# Patient Record
Sex: Male | Born: 1990 | Race: White | Hispanic: No | Marital: Single | Smoking: Never smoker
Health system: Southern US, Community
[De-identification: ages and names within clinical notes are randomized; demographics above are authoritative.]

## PROBLEM LIST (undated history)

## (undated) DIAGNOSIS — Q676 Pectus excavatum: Secondary | ICD-10-CM

## (undated) DIAGNOSIS — J45909 Unspecified asthma, uncomplicated: Secondary | ICD-10-CM

## (undated) DIAGNOSIS — T782XXA Anaphylactic shock, unspecified, initial encounter: Secondary | ICD-10-CM

## (undated) DIAGNOSIS — T7840XA Allergy, unspecified, initial encounter: Secondary | ICD-10-CM

## (undated) DIAGNOSIS — L709 Acne, unspecified: Secondary | ICD-10-CM

## (undated) DIAGNOSIS — N200 Calculus of kidney: Secondary | ICD-10-CM

## (undated) DIAGNOSIS — F909 Attention-deficit hyperactivity disorder, unspecified type: Secondary | ICD-10-CM

## (undated) HISTORY — DX: Pectus excavatum: Q67.6

## (undated) HISTORY — DX: Allergy, unspecified, initial encounter: T78.40XA

## (undated) HISTORY — DX: Calculus of kidney: N20.0

## (undated) HISTORY — DX: Attention-deficit hyperactivity disorder, unspecified type: F90.9

## (undated) HISTORY — DX: Anaphylactic shock, unspecified, initial encounter: T78.2XXA

## (undated) HISTORY — PX: PERCUTANEOUS NEPHROSTOLITHOTOMY: SHX2207

## (undated) HISTORY — DX: Acne, unspecified: L70.9

## (undated) HISTORY — DX: Unspecified asthma, uncomplicated: J45.909

---
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
## 2013-09-06 ENCOUNTER — Encounter: Payer: Self-pay | Admitting: Family Medicine

## 2013-09-06 ENCOUNTER — Ambulatory Visit (HOSPITAL_BASED_OUTPATIENT_CLINIC_OR_DEPARTMENT_OTHER)
Admission: RE | Admit: 2013-09-06 | Discharge: 2013-09-06 | Disposition: A | Discharge status: Home / Self Care | Payer: BC Managed Care – PPO | Source: Ambulatory Visit | Attending: Family Medicine | Admitting: Family Medicine

## 2013-09-06 ENCOUNTER — Ambulatory Visit (INDEPENDENT_AMBULATORY_CARE_PROVIDER_SITE_OTHER): Payer: BC Managed Care – PPO | Admitting: Family Medicine

## 2013-09-06 VITALS — BP 117/72 | HR 78 | Resp 16 | Ht 64.0 in | Wt 113.0 lb

## 2013-09-06 DIAGNOSIS — S8990XA Unspecified injury of unspecified lower leg, initial encounter: Secondary | ICD-10-CM | POA: Insufficient documentation

## 2013-09-06 DIAGNOSIS — X58XXXA Exposure to other specified factors, initial encounter: Secondary | ICD-10-CM | POA: Insufficient documentation

## 2013-09-06 DIAGNOSIS — T782XXA Anaphylactic shock, unspecified, initial encounter: Secondary | ICD-10-CM

## 2013-09-06 DIAGNOSIS — M79609 Pain in unspecified limb: Secondary | ICD-10-CM

## 2013-09-06 DIAGNOSIS — M79675 Pain in left toe(s): Secondary | ICD-10-CM

## 2013-09-06 DIAGNOSIS — S9030XA Contusion of unspecified foot, initial encounter: Secondary | ICD-10-CM | POA: Insufficient documentation

## 2013-09-06 MED ORDER — EPINEPHRINE 0.3 MG/0.3ML IJ SOAJ: 0.3000 mg | Freq: Once | INTRAMUSCULAR | Status: AC

## 2013-09-06 NOTE — Progress Notes (Signed)
  Subjective:    Patient ID: Cory Ochoa, male    DOB: 1991-08-27, 22 y.o.   MRN: [id number]  Cory Ochoa is here today with his mother complaining of pain in his left great toe.  He was playing in his room and stubbed his toe.    Injury The incident occurred 12 to 24 hours ago. The incident occurred at home. Injury mechanism: fall. There is an injury to the left great toe. The pain is mild. (Bruise)    Review of Systems  Constitutional: Negative.   HENT: Negative.   Eyes: Negative.   Respiratory: Negative.   Endocrine: Negative.   Genitourinary: Negative.   Musculoskeletal: Negative.   Skin: Negative.   Allergic/Immunologic: Negative.   Neurological: Negative.   Hematological: Bruises/bleeds easily.       Left great toe.  Psychiatric/Behavioral: Negative.      Past Medical History  Diagnosis Date  . ADHD (attention deficit hyperactivity disorder)   . Asthma   . Allergy   . Acne   . Pectus excavatum   . Anaphylaxis     Bees, Eggs, Ranch, [place], Pork,Roses     History reviewed. No pertinent past surgical history.   History   Social History Narrative   Marital Status:  Married Research officer, political party)   Children:  Sons Cory Ochoa, Cory Ochoa) Daughter Cory Ochoa)   Pets:  Dogs (2)    Living Situation: Lives with spouse and children.   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 Obesity Mother   . Hypertension Mother   . Kidney Stones Mother   . Obesity Father   . Hyperlipidemia Father   . Hypertension Father   . Obesity Sister   . Diabetes Paternal Grandmother      Allergies  Allergen Reactions  . Augmentin [Amoxicillin-Pot Clavulanate] Hives     Immunization History  Administered Date(s) Administered  . Hepatitis  A 07/01/2008  . Meningococcal Conjugate 10/09/2006  . Pneumococcal Conjugate-13 08/28/2009  . Tdap 06/16/2006       Objective:   Physical Exam  Vitals reviewed. Constitutional: He is oriented to person, place, and time. He appears well-developed and well-nourished.  Cardiovascular: Normal rate and regular rhythm.   Pulmonary/Chest: Effort normal and breath sounds normal.  Musculoskeletal: He exhibits edema and tenderness.  Left great toe is bruised.    Neurological: He is alert and oriented to person, place, and time.  Psychiatric: He has a normal mood and affect.      Assessment & Plan:    Thi was seen today for toe injury.  Diagnoses and associated orders for this visit:  Pain in joint, ankle and foot, left - DG Foot Complete Left - Normal   Anaphylactic reaction, subsequent encounter - EPINEPHrine (EPI-PEN) 0.3 mg/0.3 mL SOAJ injection; Inject 0.3 mLs (0.3 mg total) into the muscle once.

## 2013-09-10 DIAGNOSIS — M25579 Pain in unspecified ankle and joints of unspecified foot: Secondary | ICD-10-CM | POA: Insufficient documentation

## 2014-03-20 ENCOUNTER — Encounter: Payer: Self-pay | Admitting: Family Medicine

## 2014-03-20 ENCOUNTER — Ambulatory Visit (INDEPENDENT_AMBULATORY_CARE_PROVIDER_SITE_OTHER): Payer: BC Managed Care – PPO | Admitting: Family Medicine

## 2014-03-20 VITALS — BP 113/73 | HR 89 | Temp 97.9°F | Resp 16 | Ht 64.0 in | Wt 110.0 lb

## 2014-03-20 DIAGNOSIS — R059 Cough, unspecified: Secondary | ICD-10-CM

## 2014-03-20 DIAGNOSIS — J069 Acute upper respiratory infection, unspecified: Secondary | ICD-10-CM

## 2014-03-20 DIAGNOSIS — N730 Acute parametritis and pelvic cellulitis: Secondary | ICD-10-CM

## 2014-03-20 DIAGNOSIS — R05 Cough: Secondary | ICD-10-CM

## 2014-03-20 MED ORDER — BENZONATATE 200 MG PO CAPS: 200.0000 mg | ORAL_CAPSULE | Freq: Three times a day (TID) | ORAL | Status: DC | PRN

## 2014-03-20 MED ORDER — HYDROCOD POLST-CHLORPHEN POLST 10-8 MG/5ML PO LQCR: 5.0000 mL | Freq: Two times a day (BID) | ORAL | Status: DC | PRN

## 2014-03-20 NOTE — Progress Notes (Signed)
Subjective:    Patient ID: Cory Ochoa, male    DOB: 1990-11-14, 23 y.o.   MRN: [id number]  HPI  Cory Ochoa is here today with his mother Cory Ochoa([name]) complaining of URI symptoms including a cough, runny nose, and congestion. He has had these symptoms for the past 2 weeks. Mom has been giving him LawyerTessalon Perles along with varioud OTC medications and has been giving him nebulizer treatments which have not really helped hs symptoms very much.  Mom just wants to be sure he doesn't need an antibiotic.    Review of Systems  Constitutional: Positive for fatigue. Negative for fever.  HENT: Positive for congestion and rhinorrhea.   Respiratory: Positive for cough.   All other systems reviewed and are negative.    Past Medical History  Diagnosis Date  . ADHD (attention deficit hyperactivity disorder)   . Asthma   . Allergy   . Acne   . Pectus excavatum   . Anaphylaxis     Bees, Eggs, [place], [place], Pork,Roses  . Kidney stones      Past Surgical History  Procedure Laterality Date  . Percutaneous nephrostolithotomy    . Gastrostomy w/ feeding tube  1993     History   Social History Narrative   Marital Status:  Married Research officer, political party([name])   Children:  Sons Cory Bouche(Cory, Jeri Ochoa) Daughter Cory Ochoa(Cory Ochoa)   Pets:  Dogs (2)    Living Situation: Lives with spouse and children.   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 Obesity Mother   . Hypertension Mother   . Kidney Stones Mother   . Obesity Father   . Hyperlipidemia Father   . Hypertension Father   . Obesity Sister   . Diabetes Paternal Grandmother      Current Outpatient Prescriptions on File Prior to Visit  Medication Sig Dispense Refill  . EPINEPHrine (EPI-PEN) 0.3 mg/0.3 mL SOAJ  injection Inject 0.3 mLs (0.3 mg total) into the muscle once.  1 Device  4   No current facility-administered medications on file prior to visit.     Allergies  Allergen Reactions  . Augmentin [Amoxicillin-Pot Clavulanate] Hives     Immunization History  Administered Date(s) Administered  . Hepatitis A 07/01/2008  . Meningococcal Conjugate 10/09/2006  . Pneumococcal Conjugate-13 08/28/2009  . Tdap 06/16/2006       Objective:   Physical Exam  Nursing note and vitals reviewed. Constitutional: He is oriented to person, place, and time. He appears well-nourished.  Cardiovascular: Normal rate.   Pulmonary/Chest: Effort normal and breath sounds normal. He has no wheezes.  Neurological: He is alert and oriented to person, place, and time.  Skin: Skin is warm and dry.  Psychiatric: He has a normal mood and affect. His behavior is normal. Judgment and thought content normal.      Assessment & Plan:    Cory Ochoa was seen today for uri. His symptoms seem to be viral so mom was given assurance that at this time, he does not seem to need an antibiotic.  He was given medications for his cough.   Diagnoses and associated orders for this visit:  Acute infection of male upper reproductive tract  Cough - chlorpheniramine-HYDROcodone (TUSSIONEX PENNKINETIC ER) 10-8 MG/5ML LQCR; Take 5 mLs by mouth every 12 (twelve) hours as needed for cough. - benzonatate (TESSALON) 200 MG capsule; Take 1 capsule (200 mg total) by mouth 3 (three) times daily as needed for cough.

## 2014-03-20 NOTE — Patient Instructions (Signed)
1)  Head Congestion - Zyrtec 10 mg at night if he is just runny vs Zyrtec D 5/120 twice a day if runny and stuffy plus Nasonex 2 puffs each nostril at night.    2)  Chest Congestion/Cough -  Delsym 2 tsp twice a day plus Tessalon Perles up to 3 x per day plus Tussionex 1 tsp twice a day, Ricola Cough Drops vs [employer] Friends.      Cough, Adult  A cough is a reflex that helps clear your throat and airways. It can help heal the body or may be a reaction to an irritated airway. A cough may only last 2 or 3 weeks (acute) or may last more than 8 weeks (chronic).  CAUSES Acute cough:  Viral or bacterial infections. Chronic cough:  Infections.  Allergies.  Asthma.  Post-nasal drip.  Smoking.  Heartburn or acid reflux.  Some medicines.  Chronic lung problems (COPD).  Cancer. SYMPTOMS   Cough.  Fever.  Chest pain.  Increased breathing rate.  High-pitched whistling sound when breathing (wheezing).  Colored mucus that you cough up (sputum). TREATMENT   A bacterial cough may be treated with antibiotic medicine.  A viral cough must run its course and will not respond to antibiotics.  Your caregiver may recommend other treatments if you have a chronic cough. HOME CARE INSTRUCTIONS   Only take over-the-counter or prescription medicines for pain, discomfort, or fever as directed by your caregiver. Use cough suppressants only as directed by your caregiver.  Use a cold steam vaporizer or humidifier in your bedroom or home to help loosen secretions.  Sleep in a semi-upright position if your cough is worse at night.  Rest as needed.  Stop smoking if you smoke. SEEK IMMEDIATE MEDICAL CARE IF:   You have pus in your sputum.  Your cough starts to worsen.  You cannot control your cough with suppressants and are losing sleep.  You begin coughing up blood.  You have difficulty breathing.  You develop pain which is getting worse or is uncontrolled with  medicine.  You have a fever. MAKE SURE YOU:   Understand these instructions.  Will watch your condition.  Will get help right away if you are not doing well or get worse. Document Released: 04/15/2011 Document Revised: 01/09/2012 Document Reviewed: 04/15/2011 [facility] Patient Information 2014 [place], [place].

## 2014-03-21 ENCOUNTER — Ambulatory Visit: Payer: BC Managed Care – PPO | Admitting: Family Medicine

## 2014-06-09 DIAGNOSIS — R059 Cough, unspecified: Secondary | ICD-10-CM | POA: Insufficient documentation

## 2014-06-09 DIAGNOSIS — N730 Acute parametritis and pelvic cellulitis: Secondary | ICD-10-CM | POA: Insufficient documentation

## 2014-06-09 DIAGNOSIS — R05 Cough: Secondary | ICD-10-CM | POA: Insufficient documentation

## 2014-11-30 IMAGING — CR DG FOOT COMPLETE 3+V*L*
3 series · 3 of 3 positions shown · non-contrast
Comparison: None.

CLINICAL DATA: Left great toe injury. Bruising and swelling.

EXAM:
LEFT FOOT - COMPLETE 3+ VIEW

[t foot ap left]
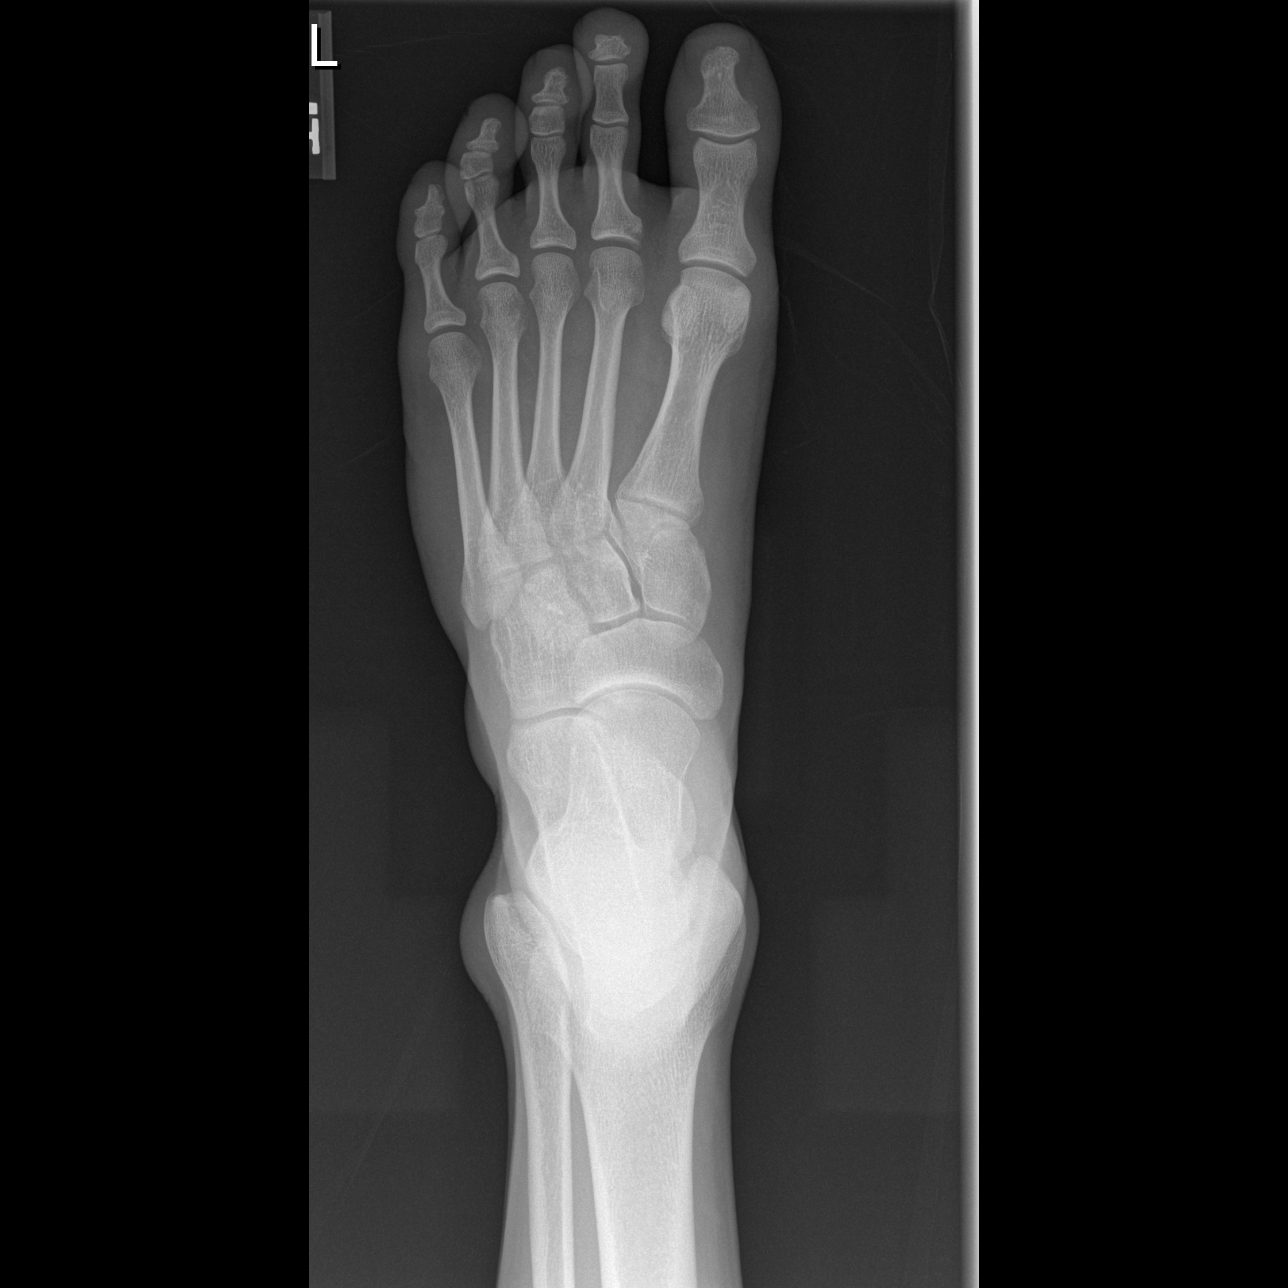

[t foot oblique left]
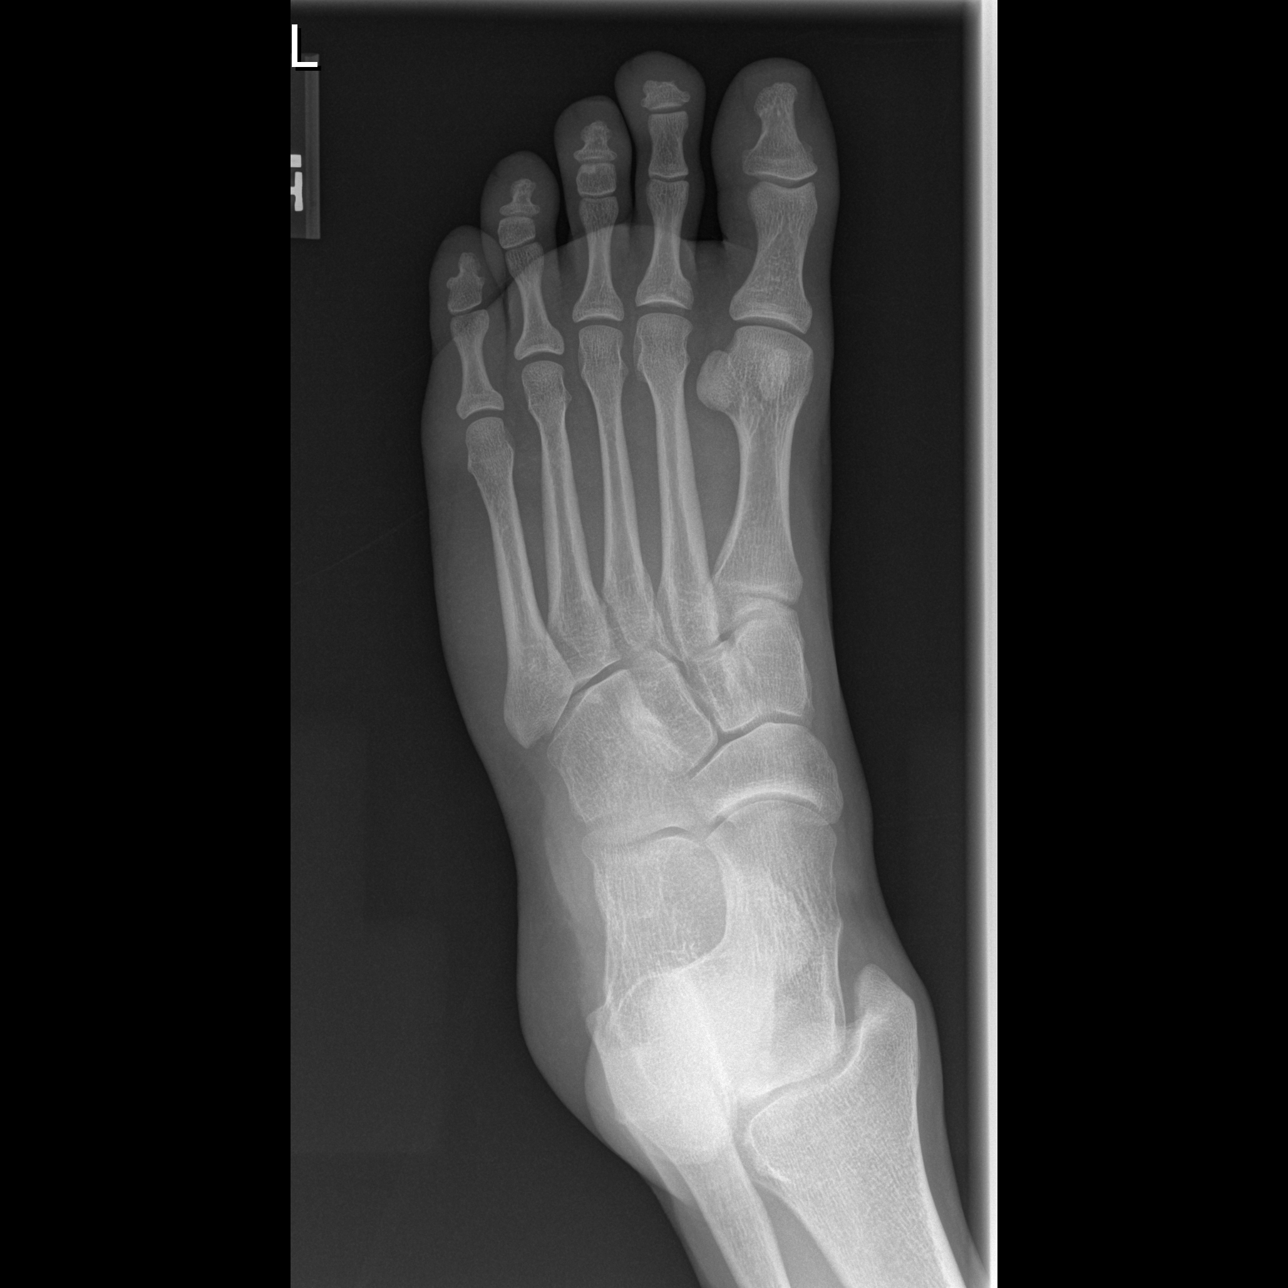

[t foot lat left]
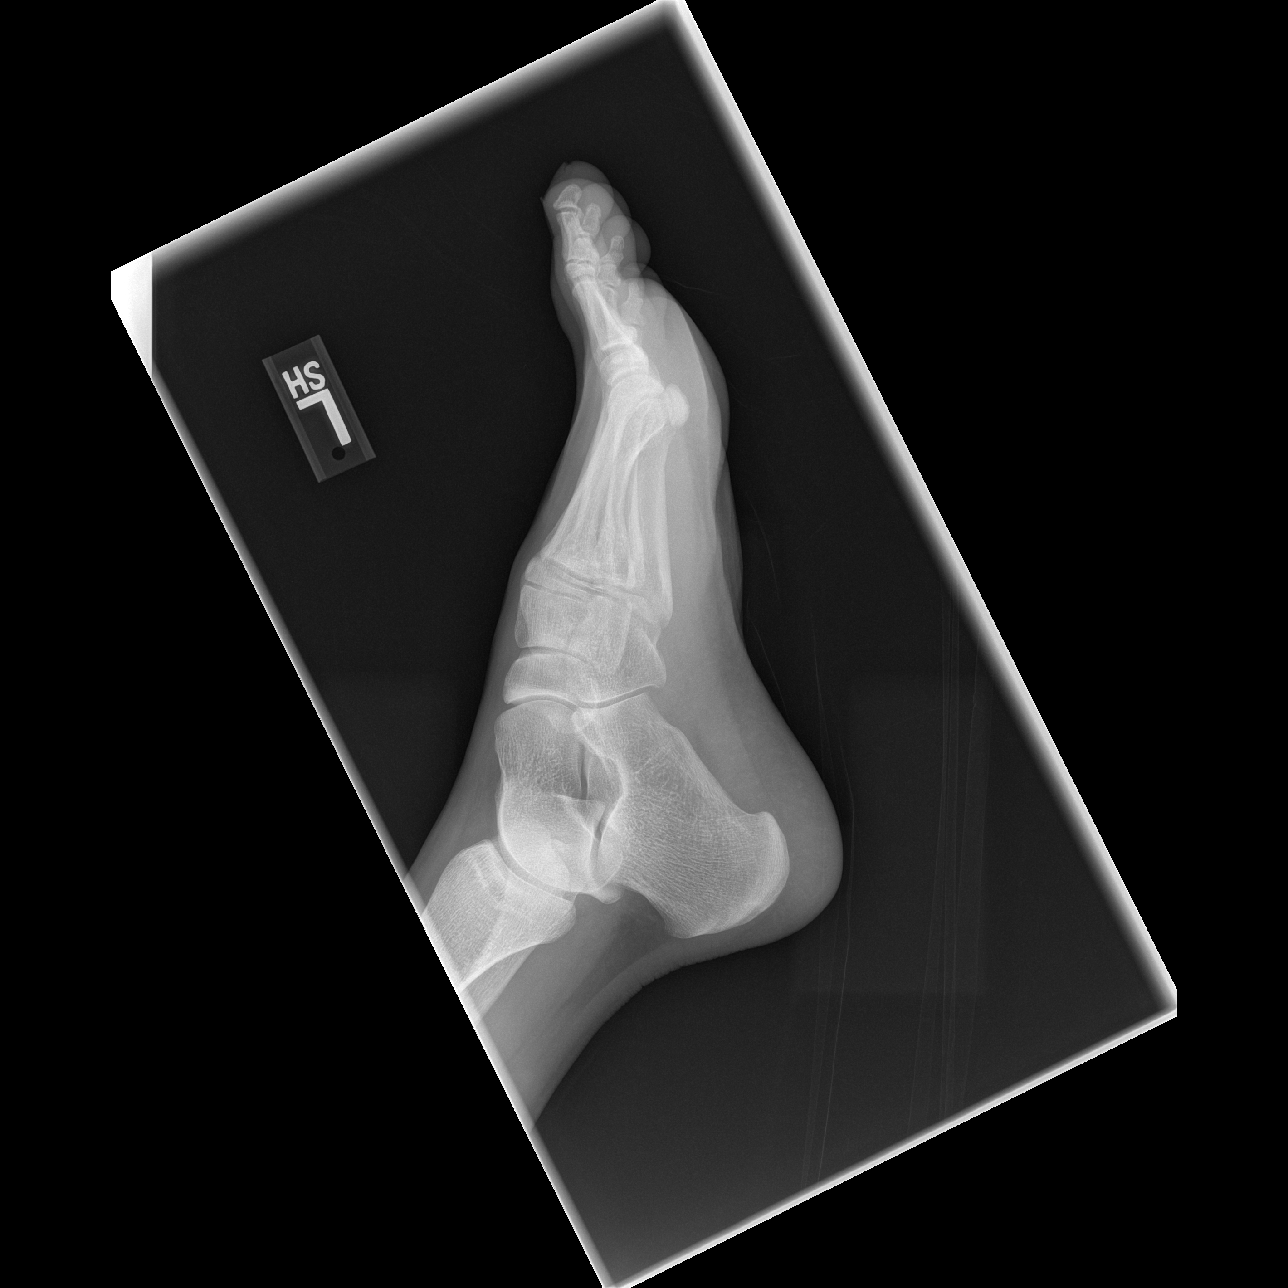

[3 of 3 positions shown; findings below may reference images not displayed]

FINDINGS: There is no evidence of fracture or dislocation. There is no
evidence of arthropathy or other focal bone abnormality. Soft
tissues are unremarkable.
IMPRESSION: Negative.

## 2023-01-20 ENCOUNTER — Ambulatory Visit
Admission: RE | Admit: 2023-01-20 | Discharge: 2023-01-20 | Disposition: A | Discharge status: Home / Self Care | Payer: Medicaid Other | Source: Ambulatory Visit | Attending: Family Medicine | Admitting: Family Medicine

## 2023-01-20 ENCOUNTER — Ambulatory Visit (INDEPENDENT_AMBULATORY_CARE_PROVIDER_SITE_OTHER): Payer: Medicaid Other

## 2023-01-20 VITALS — BP 139/93 | HR 84 | Temp 97.7°F | Resp 17

## 2023-01-20 DIAGNOSIS — R059 Cough, unspecified: Secondary | ICD-10-CM

## 2023-01-20 DIAGNOSIS — J40 Bronchitis, not specified as acute or chronic: Secondary | ICD-10-CM | POA: Diagnosis not present

## 2023-01-20 MED ORDER — BENZONATATE 200 MG PO CAPS: 200.0000 mg | ORAL_CAPSULE | Freq: Three times a day (TID) | ORAL | 0 refills | Status: AC | PRN

## 2023-01-20 MED ORDER — PREDNISONE 10 MG (21) PO TBPK: ORAL_TABLET | Freq: Every day | ORAL | 0 refills | Status: DC

## 2023-01-20 MED ORDER — DOXYCYCLINE HYCLATE 100 MG PO CAPS: 100.0000 mg | ORAL_CAPSULE | Freq: Two times a day (BID) | ORAL | 0 refills | Status: AC

## 2023-01-20 NOTE — Discharge Instructions (Addendum)
Instructed patient to take medications as directed with food to completion.  Advised to take prednisone with first dose of doxycycline for the next 7 days.  Advised may use Tessalon daily or as needed for cough.  Encouraged increase daily water intake to 64 ounces per day while taking these medications.  Advised if symptoms worsen and/or unresolved please follow-up PCP or here for further evaluation.

## 2023-01-20 NOTE — ED Provider Notes (Signed)
[facility]    CSN: [id number] Arrival date & time: 01/20/23  0902      History   Chief Complaint Chief Complaint  Patient presents with   Cough    Sore throat - Entered by patient   Sore Throat    HPI Cory Ochoa is a 32 y.o. male.   HPI Very pleasant 32 year old male presents with cough for 3 weeks.  Patient is accompanied by his Mother this morning.  PMH significant for cerebral palsy, asthma, ADHD and kidney stones.  Past Medical History:  Diagnosis Date   Acne    ADHD (attention deficit hyperactivity disorder)    Allergy    Anaphylaxis    Bees, Eggs, Ranch, [place], Pork,Roses   Asthma    Kidney stones    Pectus excavatum     Patient Active Problem List   Diagnosis Date Noted   Acute infection of male upper reproductive tract 06/09/2014   Cough 06/09/2014   Pain in joint, ankle and foot 09/10/2013    Past Surgical History:  Procedure Laterality Date   [place] Medications    Prior to Admission medications   Medication Sig Start Date End Date Taking? Authorizing Provider  benzonatate (TESSALON) 200 MG capsule Take 1 capsule (200 mg total) by mouth 3 (three) times daily as needed for up to 7 days. 01/20/23 01/27/23 Yes Eliezer Lofts, FNP  doxycycline (VIBRAMYCIN) 100 MG capsule Take 1 capsule (100 mg total) by mouth 2 (two) times daily for 7 days. 01/20/23 01/27/23 Yes Eliezer Lofts, FNP  predniSONE (STERAPRED UNI-PAK 21 TAB) 10 MG (21) TBPK tablet Take by mouth daily. Take 6 tabs by mouth daily  for 2 days, then 5 tabs for 2 days, then 4 tabs for 2 days, then 3 tabs for 2 days, 2 tabs for 2 days, then 1 tab by mouth daily for 2 days 01/20/23  Yes Eliezer Lofts, FNP    Family History Family History  Problem Relation Age of Onset   Obesity Mother    Hypertension Mother    Kidney Stones Mother    Obesity Father    Hyperlipidemia Father    Hypertension Father     Obesity Sister    Diabetes Paternal Grandmother     Social History Social History   Tobacco Use   Smoking status: Never   Smokeless tobacco: Never  Substance Use Topics   Alcohol use: No   Drug use: No     Allergies   Augmentin [amoxicillin-pot clavulanate]   Review of Systems Review of Systems  HENT:  Positive for congestion.   Respiratory:  Positive for cough.   All other systems reviewed and are negative.    Physical Exam Triage Vital Signs ED Triage Vitals  Enc Vitals Group     BP      Pulse      Resp      Temp      Temp src      SpO2      Weight      Height      Head Circumference      Peak Flow      Pain Score      Pain Loc      Pain Edu?      Excl. in [place]?    No data found.  Updated Vital Signs BP (!) 139/93   Pulse 84  Temp 97.7 F (36.5 C) (Oral)   Resp 17   SpO2 97%    Physical Exam Vitals and nursing note reviewed.  Constitutional:      Appearance: Normal appearance. He is normal weight. He is ill-appearing.  HENT:     Head: Normocephalic and atraumatic.     Right Ear: Tympanic membrane, ear canal and external ear normal.     Left Ear: Tympanic membrane, ear canal and external ear normal.     Nose: Nose normal.     Mouth/Throat:     Mouth: Mucous membranes are moist.     Pharynx: Oropharynx is clear.  Eyes:     Extraocular Movements: Extraocular movements intact.     Conjunctiva/sclera: Conjunctivae normal.     Pupils: Pupils are equal, round, and reactive to light.  Cardiovascular:     Rate and Rhythm: Normal rate and regular rhythm.     Pulses: Normal pulses.     Heart sounds: Normal heart sounds.  Pulmonary:     Effort: Pulmonary effort is normal.     Breath sounds: Wheezing and rhonchi present. No rales.     Comments: Diffuse scattered rhonchi noted throughout with mild expiratory wheezes, infrequent nonproductive cough noted on exam Musculoskeletal:        General: Normal range of motion.     Cervical back: Normal  range of motion and neck supple.  Skin:    General: Skin is warm and dry.  Neurological:     General: No focal deficit present.     Mental Status: He is alert and oriented to person, place, and time. Mental status is at baseline.      UC Treatments / Results  Labs (all labs ordered are listed, but only abnormal results are displayed) Labs Reviewed - No data to display  EKG   Radiology DG Chest 2 View  Result Date: 01/20/2023 CLINICAL DATA:  Cough. EXAM: CHEST - 2 VIEW COMPARISON:  None Available. FINDINGS: The heart size and mediastinal contours are within normal limits. Both lungs are clear. The visualized skeletal structures are unremarkable. IMPRESSION: No active cardiopulmonary disease. Electronically Signed   By: Marijo Conception M.D.   On: 01/20/2023 10:32    Procedures Procedures (including critical care time)  Medications Ordered in UC Medications - No data to display  Initial Impression / Assessment and Plan / UC Course  I have reviewed the triage vital signs and the nursing notes.  Pertinent labs & imaging results that were available during my care of the patient were reviewed by me and considered in my medical decision making (see chart for details).     MDM: 1.  Cough-CXR revealed above, Rx'd Doxycycline 100 mg twice daily for the next 7 days, Tessalon Perles 200 mg 3 times daily, as needed for the next 7 to 10 days; 2.  Bronchitis- Rx'd Sterapred Unipak tapering from 60 mg to 10 mg over 10 days. Instructed patient to take medications as directed with food to completion.  Advised to take prednisone with first dose of doxycycline for the next 7 days.  Advised may use Tessalon daily or as needed for cough.  Encouraged increase daily water intake to 64 ounces per day while taking these medications.  Advised if symptoms worsen and/or unresolved please follow-up PCP or here for further evaluation.  Final Clinical Impressions(s) / UC Diagnoses   Final diagnoses:  Cough,  unspecified type  Bronchitis     Discharge Instructions      Instructed  patient to take medications as directed with food to completion.  Advised to take prednisone with first dose of doxycycline for the next 7 days.  Advised may use Tessalon daily or as needed for cough.  Encouraged increase daily water intake to 64 ounces per day while taking these medications.  Advised if symptoms worsen and/or unresolved please follow-up PCP or here for further evaluation.     ED Prescriptions     Medication Sig Dispense Auth. Provider   doxycycline (VIBRAMYCIN) 100 MG capsule Take 1 capsule (100 mg total) by mouth 2 (two) times daily for 7 days. 14 capsule Eliezer Lofts, FNP   predniSONE (STERAPRED UNI-PAK 21 TAB) 10 MG (21) TBPK tablet Take by mouth daily. Take 6 tabs by mouth daily  for 2 days, then 5 tabs for 2 days, then 4 tabs for 2 days, then 3 tabs for 2 days, 2 tabs for 2 days, then 1 tab by mouth daily for 2 days 42 tablet Eliezer Lofts, FNP   benzonatate (TESSALON) 200 MG capsule Take 1 capsule (200 mg total) by mouth 3 (three) times daily as needed for up to 7 days. 40 capsule Eliezer Lofts, FNP      PDMP not reviewed this encounter.   Eliezer Lofts, [place] 01/20/23 1051

## 2023-01-20 NOTE — ED Triage Notes (Signed)
Pt presents with mother.  Mother reports pt has had a cough for at least 3 weeks that has not gotten better. Has been taking Delsym, Dayquil and Nyquil. Also noticed he winces when swallowing indicating he may have a sore throat this past week,

## 2023-02-08 ENCOUNTER — Ambulatory Visit
Admission: RE | Admit: 2023-02-08 | Discharge: 2023-02-08 | Disposition: A | Discharge status: Home / Self Care | Payer: Medicaid Other | Source: Ambulatory Visit | Attending: Family Medicine | Admitting: Family Medicine

## 2023-02-08 VITALS — BP 125/82 | HR 98 | Temp 97.6°F | Resp 17

## 2023-02-08 DIAGNOSIS — J4541 Moderate persistent asthma with (acute) exacerbation: Secondary | ICD-10-CM | POA: Diagnosis not present

## 2023-02-08 DIAGNOSIS — R059 Cough, unspecified: Secondary | ICD-10-CM | POA: Diagnosis not present

## 2023-02-08 MED ORDER — IPRATROPIUM-ALBUTEROL 0.5-2.5 (3) MG/3ML IN SOLN: 3.0000 mL | Freq: Every day | RESPIRATORY_TRACT | 1 refills | Status: AC | PRN

## 2023-02-08 MED ORDER — FLUTICASONE-SALMETEROL 500-50 MCG/ACT IN AEPB: 1.0000 | INHALATION_SPRAY | Freq: Two times a day (BID) | RESPIRATORY_TRACT | 0 refills | Status: DC

## 2023-02-08 MED ORDER — QVAR REDIHALER 40 MCG/ACT IN AERB: 2.0000 | INHALATION_SPRAY | Freq: Two times a day (BID) | RESPIRATORY_TRACT | 0 refills | Status: DC

## 2023-02-08 NOTE — ED Triage Notes (Signed)
Pt here today with mother who says he continues to have a lingering cough x 1 month. Was seen on 3/22, tx with abx and steroids. Also taking allegra daily x 1 wk. Hx of asthma.

## 2023-02-08 NOTE — ED Provider Notes (Signed)
[facility]    CSN: [id number] Arrival date & time: 02/08/23  1251      History   Chief Complaint Chief Complaint  Patient presents with   Cough    HPI Cory Ochoa is a 32 y.o. male.   HPI Pleasant 32 year old male presents with lingering cough for 1 month.  Patient is accompanied by his mother today and reports evaluated on 322 treated with antibiotics and steroids and is to taking Allegra.  Patient has history of ADHD and asthma.  Past Medical History:  Diagnosis Date   Acne    ADHD (attention deficit hyperactivity disorder)    Allergy    Anaphylaxis    Bees, Eggs, Ranch, [place], Pork,Roses   Asthma    Kidney stones    Pectus excavatum     Patient Active Problem List   Diagnosis Date Noted   Acute infection of male upper reproductive tract 06/09/2014   Cough 06/09/2014   Pain in joint, ankle and foot 09/10/2013    Past Surgical History:  Procedure Laterality Date   GASTROSTOMY W/ FEEDING TUBE  1993   PERCUTANEOUS NEPHROSTOLITHOTOMY         Home Medications    Prior to Admission medications   Medication Sig Start Date End Date Taking? Authorizing Provider  beclomethasone (QVAR REDIHALER) 40 MCG/ACT inhaler Inhale 2 puffs into the lungs 2 (two) times daily. 02/08/23  Yes Trevor Iha, FNP  fluticasone-salmeterol (ADVAIR DISKUS) 500-50 MCG/ACT AEPB Inhale 1 puff into the lungs in the morning and at bedtime. 02/08/23 03/10/23 Yes Trevor Iha, FNP  ipratropium-albuterol (DUONEB) 0.5-2.5 (3) MG/3ML SOLN Take 3 mLs by nebulization daily as needed. 02/08/23  Yes Trevor Iha, FNP    Family History Family History  Problem Relation Age of Onset   Obesity Mother    Hypertension Mother    Kidney Stones Mother    Obesity Father    Hyperlipidemia Father    Hypertension Father    Obesity Sister    Diabetes Paternal Grandmother     Social History Social History   Tobacco Use   Smoking status: Never   Smokeless tobacco: Never  Substance  Use Topics   Alcohol use: No   Drug use: No     Allergies   Augmentin [amoxicillin-pot clavulanate]   Review of Systems Review of Systems  Respiratory:  Positive for cough.   All other systems reviewed and are negative.    Physical Exam Triage Vital Signs ED Triage Vitals  Enc Vitals Group     BP 02/08/23 1315 125/82     Pulse Rate 02/08/23 1315 98     Resp 02/08/23 1315 17     Temp 02/08/23 1315 97.6 F (36.4 C)     Temp Source 02/08/23 1315 Oral     SpO2 02/08/23 1315 95 %     Weight --      Height --      Head Circumference --      Peak Flow --      Pain Score 02/08/23 1318 0     Pain Loc --      Pain Edu? --      Excl. in [place]? --    No data found.  Updated Vital Signs BP 125/82 (BP Location: Left Arm)   Pulse 98   Temp 97.6 F (36.4 C) (Oral)   Resp 17   SpO2 95%       Physical Exam Vitals and nursing note reviewed.  Constitutional:  Appearance: Normal appearance. He is normal weight.  HENT:     Head: Normocephalic and atraumatic.     Right Ear: Tympanic membrane, ear canal and external ear normal.     Left Ear: Tympanic membrane, ear canal and external ear normal.     Mouth/Throat:     Mouth: Mucous membranes are moist.     Pharynx: Oropharynx is clear.  Eyes:     Extraocular Movements: Extraocular movements intact.     Conjunctiva/sclera: Conjunctivae normal.     Pupils: Pupils are equal, round, and reactive to light.  Cardiovascular:     Rate and Rhythm: Normal rate and regular rhythm.     Pulses: Normal pulses.     Heart sounds: Normal heart sounds.  Pulmonary:     Effort: Pulmonary effort is normal.     Breath sounds: Normal breath sounds. No wheezing, rhonchi or rales.     Comments: Diminished breath sounds bibasilarly, frequent nonproductive cough noted on exam Musculoskeletal:        General: Normal range of motion.     Cervical back: Normal range of motion and neck supple.  Skin:    General: Skin is warm and dry.   Neurological:     General: No focal deficit present.     Mental Status: He is alert and oriented to person, place, and time. Mental status is at baseline.      UC Treatments / Results  Labs (all labs ordered are listed, but only abnormal results are displayed) Labs Reviewed - No data to display  EKG   Radiology No results found.  Procedures Procedures (including critical care time)  Medications Ordered in UC Medications - No data to display  Initial Impression / Assessment and Plan / UC Course  I have reviewed the triage vital signs and the nursing notes.  Pertinent labs & imaging results that were available during my care of the patient were reviewed by me and considered in my medical decision making (see chart for details).     MDM: 1.  Cough, unspecified type-Rx'd DuoNeb 0.5-2.5 (3) mg/mL take 3 mL spine ambulation daily or as needed; 2.  Moderate persistent asthma with exacerbation-Rx'd Qvar 500-50 mcg/ACT 1 puff a.m./1 puff p.m. for moderate persistent asthma with exacerbation.  [facility]ouraged Mother [facility] [facility] prior to discharge today. Educated Mother with regard to need for new pulmonary function test to determine at what current stage of asthma her son. Advised Mother may use DuoNeb daily or as needed for asthma exacerbation.  Advised Mother/patient to use Advair Diskus inhaler twice daily.  Encouraged increase daily water intake while taking these medications.  Advised if symptoms worsen and/or unresolved please follow-up with PCP or here for further evaluation. Final Clinical Impressions(s) / UC Diagnoses   Final diagnoses:  Cough, unspecified type  Moderate persistent asthma with exacerbation     Discharge Instructions      Advised Mother may use DuoNeb daily or as needed for asthma exacerbation.  Advised Mother/patient to use Advair Diskus inhaler twice daily.  Encouraged increase daily water  intake while taking these medications.  Advised if symptoms worsen and/or unresolved please follow-up with PCP or here for further evaluation.     ED Prescriptions     Medication Sig Dispense Auth. Provider   ipratropium-albuterol (DUONEB) 0.5-2.5 (3) MG/3ML SOLN Take 3 mLs by nebulization daily as needed. 360 mL Trevor Ihaagan, Remedy Corporan, FNP   beclomethasone (QVAR REDIHALER) 40 MCG/ACT  inhaler Inhale 2 puffs into the lungs 2 (two) times daily. 1 each Trevor Iha, FNP   fluticasone-salmeterol (ADVAIR DISKUS) 500-50 MCG/ACT AEPB Inhale 1 puff into the lungs in the morning and at bedtime. 60 each Trevor Iha, FNP      PDMP not reviewed this encounter.   Trevor Iha, FNP 02/08/23 1441

## 2023-02-08 NOTE — Discharge Instructions (Addendum)
Advised Mother may use DuoNeb daily or as needed for asthma exacerbation.  Advised Mother/patient to use Advair Diskus inhaler twice daily.  Encouraged increase daily water intake while taking these medications.  Advised if symptoms worsen and/or unresolved please follow-up with PCP or here for further evaluation.

## 2023-02-09 ENCOUNTER — Telehealth: Payer: Self-pay | Admitting: Emergency Medicine

## 2023-02-09 NOTE — Telephone Encounter (Signed)
Spoke with patient's mother states that patient is feeling better.  No questions or concerns.  Will follow up as needed.

## 2023-02-13 ENCOUNTER — Encounter: Payer: Self-pay | Admitting: Family Medicine

## 2023-02-13 ENCOUNTER — Ambulatory Visit (INDEPENDENT_AMBULATORY_CARE_PROVIDER_SITE_OTHER): Payer: Medicaid Other | Admitting: Family Medicine

## 2023-02-13 VITALS — BP 120/77 | HR 83 | Temp 98.2°F | Resp 18 | Ht 65.0 in | Wt 153.0 lb

## 2023-02-13 DIAGNOSIS — T782XXA Anaphylactic shock, unspecified, initial encounter: Secondary | ICD-10-CM | POA: Insufficient documentation

## 2023-02-13 DIAGNOSIS — J4540 Moderate persistent asthma, uncomplicated: Secondary | ICD-10-CM

## 2023-02-13 DIAGNOSIS — T782XXS Anaphylactic shock, unspecified, sequela: Secondary | ICD-10-CM | POA: Diagnosis not present

## 2023-02-13 DIAGNOSIS — Z7689 Persons encountering health services in other specified circumstances: Secondary | ICD-10-CM | POA: Diagnosis not present

## 2023-02-13 DIAGNOSIS — Z13220 Encounter for screening for lipoid disorders: Secondary | ICD-10-CM | POA: Insufficient documentation

## 2023-02-13 DIAGNOSIS — R059 Cough, unspecified: Secondary | ICD-10-CM

## 2023-02-13 DIAGNOSIS — Z13228 Encounter for screening for other metabolic disorders: Secondary | ICD-10-CM | POA: Insufficient documentation

## 2023-02-13 DIAGNOSIS — G809 Cerebral palsy, unspecified: Secondary | ICD-10-CM | POA: Insufficient documentation

## 2023-02-13 MED ORDER — EPINEPHRINE 0.3 MG/0.3ML IJ SOAJ
0.3000 mg | INTRAMUSCULAR | 1 refills | Status: AC | PRN
Start: 2023-02-13 — End: ?

## 2023-02-13 MED ORDER — BUDESONIDE-FORMOTEROL FUMARATE 80-4.5 MCG/ACT IN AERO
2.0000 | INHALATION_SPRAY | Freq: Two times a day (BID) | RESPIRATORY_TRACT | 3 refills | Status: DC
Start: 2023-02-13 — End: 2024-03-22

## 2023-02-13 MED ORDER — AIRSUPRA 90-80 MCG/ACT IN AERO
2.0000 | INHALATION_SPRAY | Freq: Four times a day (QID) | RESPIRATORY_TRACT | 1 refills | Status: DC | PRN
Start: 2023-02-13 — End: 2023-03-13

## 2023-02-13 NOTE — Progress Notes (Signed)
.  New Patient Office Visit  Subjective    Patient ID: Cory Ochoa, male    DOB: [birth year]  Age: 32 y.o. MRN: [id number]  CC:  Chief Complaint  Patient presents with   Establish Care    Patient is here to establish care with new PCP ,  with his mother Cory Ochoa patient , Patients mother said that her concerns today are that patient has been to Urgent care twice within the last month for Bronchitis , she states that he was prescribed Nebulizer treatment , antibiotics, and steriods. He was also prescribed an inhaler that he has not been able to obtain due to insurance reasons    HPI Cory Ochoa presents to establish care with this practice. He is new to me. 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 Present today with his mother, Cory Ochoa, who reports that he has been to the urgent care twice within the past month for bronchitis. Prescribed inhalers unable to obtain due to cost, has been using Duoneb once per day per urgent care instructions. Got a viral illness over one month ago and has not been able to get rid of cough. Has been treated for bronchitis with antibiotic and steroid without resolution in symptoms. Has not started on maintenance inhaler due to insurance and cost.   Asthma/cough:  Denies shortness of breath and wheezing. No fever of chills. Persistent cough while in exam room likely lingering from recent viral illness.  Has not been taking anything OTC for cough.   History reviewed. 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 Needs refill on epi pen today.  Medications reviewed.  Chart review: 02/08/23 urgent care visit for cough. Inhalers prescribed.    Outpatient Encounter Medications as of 02/13/2023  Medication Sig   Albuterol-Budesonide (AIRSUPRA) 90-80 MCG/ACT AERO Inhale 2 Inhalations into the lungs every 6 (six) hours as needed (shortness of breath or wheezing).   budesonide-formoterol (SYMBICORT) 80-4.5 MCG/ACT inhaler Inhale 2 puffs into the lungs 2 (two) times  daily.   ipratropium-albuterol (DUONEB) 0.5-2.5 (3) MG/3ML SOLN Take 3 mLs by nebulization daily as needed.   [DISCONTINUED] albuterol (VENTOLIN HFA) 108 (90 Base) MCG/ACT inhaler Inhale 2 puffs into the lungs every 4 (four) hours as needed.   EPINEPHrine 0.3 mg/0.3 mL IJ SOAJ injection Inject 0.3 mg into the muscle as needed.   [DISCONTINUED] beclomethasone (QVAR REDIHALER) 40 MCG/ACT inhaler Inhale 2 puffs into the lungs 2 (two) times daily. (Patient not taking: Reported on 02/13/2023)   [DISCONTINUED] EPINEPHrine 0.3 mg/0.3 mL IJ SOAJ injection Inject 0.3 mg into the muscle as needed. (Patient not taking: Reported on 02/13/2023)   [DISCONTINUED] fluticasone-salmeterol (ADVAIR DISKUS) 500-50 MCG/ACT AEPB Inhale 1 puff into the lungs in the morning and at bedtime. (Patient not taking: Reported on 02/13/2023)   No facility-administered encounter medications on file as of 02/13/2023.    Past Medical History:  Diagnosis Date   Acne    ADHD (attention deficit hyperactivity disorder)    Allergy    Anaphylaxis    Bees, Eggs, Ranch, Mayo, Pork,Roses   Asthma    Kidney stones    Pectus excavatum     Past Surgical History:  Procedure Laterality Date   GASTROSTOMY W/ FEEDING TUBE  1993   PERCUTANEOUS NEPHROSTOLITHOTOMY      Family History  Adopted: Yes  Problem Relation Age of Onset   Obesity Mother    Hypertension Mother    Kidney Stones Mother    Obesity Father    Hyperlipidemia Father  Hypertension Father    Obesity Sister    Diabetes Paternal Grandmother     Social History   Socioeconomic History   Marital status: Single    Spouse name: Not on file   Number of children: Not on file   Years of education: Not on file   Highest education level: Not on file  Occupational History   Not on file  Tobacco Use   Smoking status: Never   Smokeless tobacco: Never  Substance and Sexual Activity   Alcohol use: No   Drug use: No   Sexual activity: Never  Other Topics Concern    Not on file  Social History Narrative   Marital Status:  Married Research officer, political party)   Children:  Sons Cory Ochoa, Cory Ochoa) Daughter Cory Ochoa)   Pets:  Dogs (2)    Living Situation: Lives with spouse and children.   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  Respiratory:  Positive for cough. Negative for shortness of breath and wheezing.         Objective    BP 120/77   Pulse 83   Temp 98.2 F (36.8 C) (Oral)   Resp 18   Ht 5\' 5"  (1.651 m)   Wt 153 lb (69.4 kg)   SpO2 97%   BMI 25.46 kg/m   Physical Exam Vitals and nursing note reviewed.  Constitutional:      General: He is not in acute distress.    Appearance: Normal appearance. He is not ill-appearing.  Cardiovascular:     Rate and Rhythm: Regular rhythm.     Heart sounds: Normal heart sounds.  Pulmonary:     Effort: Pulmonary effort is normal.     Breath sounds: Normal breath sounds.  Musculoskeletal:        General: Normal range of motion.  Skin:    General: Skin is warm and dry.     Capillary Refill: Capillary refill takes less than 2 seconds.  Neurological:     General: No focal deficit present.     Mental Status: He is alert. Mental status is at baseline.  Psychiatric:        Mood and Affect: Mood normal.        Behavior: Behavior normal.        Thought Content: Thought content normal.        Judgment: Judgment normal.        Assessment & Plan:   Problem List Items  Addressed This Visit     Cough  Lingering cough from recent viral illness. Has not been using cough suppressant. Has been treated with antibiotic and steroids without resolution. Lungs clear. No wheezing, no shortness of breath.  Recommend OTC cough suppressant as well as honey. Elevate to sleep. Explained the nature of cough and how it can take weeks to resolve after illness. Supportive therapy, adequate hydration.  Establishing care with new doctor, encounter for - Primary   Moderate persistent asthma  Unable to start inhalers prescribed per urgent care due to cost. Will start albuterol-budesonide as needed for wheezing and shortness of breath/rescue.  Symbicort 80-4.5 mcq inhaler twice per day for maintenance therapy for better control of asthma. Mother will let me know if insurance does not cover these medications. May need referral to pulmonary for PFT's if control is not obtained. Denies shortness of breath and wheezing.    Relevant Medications   Albuterol-Budesonide (AIRSUPRA) 90-80 MCG/ACT AERO   budesonide-formoterol (SYMBICORT) 80-4.5 MCG/ACT inhaler   Anaphylactic syndrome  History of anaphylaxis with bee stings. Refill sent.    Relevant Medications   EPINEPHrine 0.3 mg/0.3 mL IJ SOAJ injection  Agrees with plan of care discussed.  Questions answered. Will consider referral to pulmonary in the future if needed.   Return in about 4 weeks (around 03/13/2023) for asthma.   Novella Olive, FNP

## 2023-02-13 NOTE — Patient Instructions (Signed)
OTC cough medicine.

## 2023-03-13 ENCOUNTER — Encounter: Payer: Self-pay | Admitting: Family Medicine

## 2023-03-13 ENCOUNTER — Ambulatory Visit (INDEPENDENT_AMBULATORY_CARE_PROVIDER_SITE_OTHER): Payer: Medicaid Other | Admitting: Family Medicine

## 2023-03-13 VITALS — BP 119/71 | HR 78 | Ht 65.0 in | Wt 155.1 lb

## 2023-03-13 DIAGNOSIS — J4540 Moderate persistent asthma, uncomplicated: Secondary | ICD-10-CM | POA: Diagnosis not present

## 2023-03-13 MED ORDER — ALBUTEROL SULFATE HFA 108 (90 BASE) MCG/ACT IN AERS
2.0000 | INHALATION_SPRAY | Freq: Four times a day (QID) | RESPIRATORY_TRACT | 1 refills | Status: AC | PRN
Start: 2023-03-13 — End: ?

## 2023-03-13 NOTE — Progress Notes (Signed)
   Established Patient Office Visit  Subjective   Patient ID: Cory Ochoa, male    DOB: 17-Nov-1990  Age: 32 y.o. MRN: [id number]  Chief Complaint  Patient presents with   Asthma    Patient in  office with caregiver for Asthma f/u visit. States symbicort is helping  but could not get the [place] due to insurance problem.     HPI Follow-up for asthma, not coughing now, Symbicort is working well. Was not able to get Air Suppra due to insurance coverage. Reports his asthma is well controlled currently. Denies cough, wheezing, and shortness of breath.   Will continue Symbicort twice daily. No refills needed. Will send Albuterol rescue today.    Review of Systems  Constitutional:  Negative for chills and fever.  Respiratory:  Negative for cough and shortness of breath.   Cardiovascular:  Negative for chest pain.      Objective:     BP 119/71   Pulse 78   Ht 5\' 5"  (1.651 m)   Wt 155 lb 2 oz (70.4 kg)   SpO2 96%   BMI 25.81 kg/m    Physical Exam Vitals and nursing note reviewed.  Constitutional:      General: He is not in acute distress.    Appearance: Normal appearance.  Cardiovascular:     Rate and Rhythm: Regular rhythm.     Heart sounds: Normal heart sounds.  Pulmonary:     Effort: Pulmonary effort is normal.     Breath sounds: Normal breath sounds.  Skin:    General: Skin is warm and dry.     Capillary Refill: Capillary refill takes less than 2 seconds.  Neurological:     General: No focal deficit present.     Mental Status: He is alert. Mental status is at baseline.  Psychiatric:        Mood and Affect: Mood normal.        Behavior: Behavior normal.     No results found for any visits on 03/13/23.    The ASCVD Risk score (Arnett DK, et al., 2019) failed to calculate for the following reasons:   The 2019 ASCVD risk score is only valid for ages 63 to 7    Assessment & Plan:   Problem List Items Addressed This Visit     Moderate persistent asthma  - Primary  Symptoms are well controlled on Symbicort. Unable to get Air Suppra due to insurance coverage. Will send Albuterol for rescue inhaler. Mother will let provider know if he starts to need rescue more than 2 times per week. Denies cough, shortness of breath and wheezing.     Relevant Medications   albuterol (VENTOLIN HFA) 108 (90 Base) MCG/ACT inhaler  Agrees with plan of care discussed.  Questions answered.   Return in about 4 weeks (around 04/10/2023) for cpe with labs .    Novella Olive, FNP

## 2023-04-03 ENCOUNTER — Encounter: Payer: Medicaid Other | Admitting: Family Medicine

## 2023-04-18 ENCOUNTER — Ambulatory Visit (INDEPENDENT_AMBULATORY_CARE_PROVIDER_SITE_OTHER): Payer: Medicaid Other | Admitting: Family Medicine

## 2023-04-18 ENCOUNTER — Encounter: Payer: Self-pay | Admitting: Family Medicine

## 2023-04-18 VITALS — BP 105/68 | HR 61 | Temp 97.6°F | Resp 20 | Ht 65.0 in | Wt 158.2 lb

## 2023-04-18 DIAGNOSIS — Z13220 Encounter for screening for lipoid disorders: Secondary | ICD-10-CM | POA: Diagnosis not present

## 2023-04-18 DIAGNOSIS — Z0000 Encounter for general adult medical examination without abnormal findings: Secondary | ICD-10-CM | POA: Diagnosis not present

## 2023-04-18 DIAGNOSIS — Z136 Encounter for screening for cardiovascular disorders: Secondary | ICD-10-CM | POA: Diagnosis not present

## 2023-04-18 DIAGNOSIS — Z131 Encounter for screening for diabetes mellitus: Secondary | ICD-10-CM | POA: Diagnosis not present

## 2023-04-18 DIAGNOSIS — R5382 Chronic fatigue, unspecified: Secondary | ICD-10-CM | POA: Diagnosis not present

## 2023-04-18 NOTE — Progress Notes (Addendum)
Complete physical exam  Patient: Cory Ochoa   DOB: 16-Oct-1991   32 y.o. Male  MRN: [id number]  Subjective:    Chief Complaint  Patient presents with   Annual Exam    Patient is fasting for lab work this morning    Cory Ochoa is a 32 y.o. male who presents today for a complete physical exam. He reports consuming a general diet. The patient does not participate in regular exercise at present. He generally feels well. He reports sleeping well. He does not have additional problems to discuss today.   Chronic fatigue: reports feeling tired at times. Will check labs today.    Most recent fall risk assessment:    02/13/2023    8:43 AM  Fall Risk   Falls in the past year? 0  Number falls in past yr: 0  Injury with Fall? 0  Risk for fall due to : No Fall Risks  Follow up Falls evaluation completed     Most recent depression screenings:    02/13/2023    8:43 AM  PHQ 2/9 Scores  PHQ - 2 Score 0  PHQ- 9 Score 0    Vision:Within last year and Dental: No current dental problems and Receives regular dental care    Patient Care Team: Novella Olive, FNP as PCP - General (Family Medicine)   Outpatient Medications Prior to Visit  Medication Sig   albuterol (VENTOLIN HFA) 108 (90 Base) MCG/ACT inhaler Inhale 2 puffs into the lungs every 6 (six) hours as needed for wheezing or shortness of breath.   budesonide-formoterol (SYMBICORT) 80-4.5 MCG/ACT inhaler Inhale 2 puffs into the lungs 2 (two) times daily.   EPINEPHrine 0.3 mg/0.3 mL IJ SOAJ injection Inject 0.3 mg into the muscle as needed.   ipratropium-albuterol (DUONEB) 0.5-2.5 (3) MG/3ML SOLN Take 3 mLs by nebulization daily as needed.   No facility-administered medications prior to visit.    Review of Systems  Constitutional:  Positive for malaise/fatigue.  HENT:  Negative for ear discharge, ear pain, hearing loss and sore throat.   Eyes:  Negative for blurred vision and double vision.  Respiratory:  Negative for  shortness of breath and wheezing.   Cardiovascular:  Negative for chest pain.  Gastrointestinal:  Negative for abdominal pain, nausea and vomiting.  Neurological:  Negative for dizziness and headaches.  Psychiatric/Behavioral:  Negative for depression and suicidal ideas.           Objective:     BP 105/68   Pulse 61   Temp 97.6 F (36.4 C) (Oral)   Resp 20   Ht 5\' 5"  (1.651 m)   Wt 158 lb 3.2 oz (71.8 kg)   SpO2 97%   BMI 26.33 kg/m  BP Readings from Last 3 Encounters:  04/18/23 105/68  03/13/23 119/71  02/13/23 120/77      Physical Exam Vitals and nursing note reviewed.  Constitutional:      General: He is not in acute distress.    Appearance: Normal appearance. He is normal weight.  HENT:     Head: Normocephalic.     Right Ear: Tympanic membrane normal.     Left Ear: Tympanic membrane normal.     Nose: Nose normal.     Mouth/Throat:     Mouth: Mucous membranes are moist.     Pharynx: Oropharynx is clear.  Neck:     Thyroid: No thyroid tenderness.  Cardiovascular:     Rate and Rhythm: Normal rate and regular rhythm.  Pulses: Normal pulses.     Heart sounds: Normal heart sounds.  Pulmonary:     Effort: Pulmonary effort is normal.     Breath sounds: Normal breath sounds.  Abdominal:     General: Bowel sounds are normal.     Palpations: Abdomen is soft.     Tenderness: There is no abdominal tenderness.  Musculoskeletal:        General: Normal range of motion.     Right lower leg: No edema.     Left lower leg: No edema.  Lymphadenopathy:     Cervical:     Right cervical: No superficial cervical adenopathy.    Left cervical: No superficial cervical adenopathy.  Skin:    General: Skin is warm and dry.  Neurological:     General: No focal deficit present.     Mental Status: He is alert. Mental status is at baseline.  Psychiatric:        Mood and Affect: Mood normal.        Behavior: Behavior normal.        Thought Content: Thought content normal.         Judgment: Judgment normal.      No results found for any visits on 04/18/23.     Assessment & Plan:    Routine Health Maintenance and Physical Exam  Immunization History  Administered Date(s) Administered   Hepatitis A 07/01/2008   Meningococcal Conjugate 10/09/2006   Pneumococcal Conjugate-13 08/28/2009   Tdap 06/16/2006    Health Maintenance  Topic Date Due   COVID-19 Vaccine (1) 05/04/2023 (Originally 05/20/1991)   Hepatitis C Screening  04/17/2024 (Originally 11/19/2008)   HIV Screening  04/17/2024 (Originally 11/19/2005)   INFLUENZA VACCINE  06/01/2023   HPV VACCINES  Aged Out   DTaP/Tdap/Td  Discontinued    Discussed health benefits of physical activity, and encouraged him to engage in regular exercise appropriate for his age and condition.  Annual physical exam  Chronic fatigue -     CBC with Differential/Platelet -     Comprehensive metabolic panel -     TSH + free T4 -     Hemoglobin A1c  Encounter for lipid screening for cardiovascular disease -     Lipid panel    Routine labs ordered. HCM reviewed/discussed. Anticipatory guidance regarding healthy weight, lifestyle and choices given. Recommend healthy diet.  Recommend approximately 150 minutes/week of moderate intensity exercise. Resistance training is good for building muscles and for bone health. Muscle mass helps to increase our metabolism and to burn more calories at rest.  Limit alcohol consumption: no more than one drink per day for women and 2 drinks per day for me. Recommend regular dental and vision exams. Always use seatbelt/lap and shoulder restraints. Recommend using smoke alarms and checking batteries at least twice a year. Recommend using sunscreen when outside. Agrees with plan of care discussed.  Questions answered.      Return in about 1 year (around 04/17/2024) for cpe with labs.     Novella Olive, FNP

## 2023-04-19 LAB — HEMOGLOBIN A1C
Est. average glucose Bld gHb Est-mCnc: 105 mg/dL
Hgb A1c MFr Bld: 5.3 % (ref 4.8–5.6)

## 2023-04-19 LAB — CBC WITH DIFFERENTIAL/PLATELET
Basophils Absolute: 0 10*3/uL (ref 0.0–0.2)
Basos: 1 %
EOS (ABSOLUTE): 0.1 10*3/uL (ref 0.0–0.4)
Eos: 2 %
Hematocrit: 49.4 % (ref 37.5–51.0)
Hemoglobin: 16.2 g/dL (ref 13.0–17.7)
Immature Grans (Abs): 0 10*3/uL (ref 0.0–0.1)
Immature Granulocytes: 0 %
Lymphocytes Absolute: 2.4 10*3/uL (ref 0.7–3.1)
Lymphs: 39 %
MCH: 27.9 pg (ref 26.6–33.0)
MCHC: 32.8 g/dL (ref 31.5–35.7)
MCV: 85 fL (ref 79–97)
Monocytes Absolute: 0.6 10*3/uL (ref 0.1–0.9)
Monocytes: 9 %
Neutrophils Absolute: 3 10*3/uL (ref 1.4–7.0)
Neutrophils: 49 %
Platelets: 316 10*3/uL (ref 150–450)
RBC: 5.81 x10E6/uL — ABNORMAL HIGH (ref 4.14–5.80)
RDW: 13.1 % (ref 11.6–15.4)
WBC: 6.2 10*3/uL (ref 3.4–10.8)

## 2023-04-19 LAB — COMPREHENSIVE METABOLIC PANEL
ALT: 37 IU/L (ref 0–44)
AST: 23 IU/L (ref 0–40)
Albumin: 4.5 g/dL (ref 4.1–5.1)
Alkaline Phosphatase: 86 IU/L (ref 44–121)
BUN/Creatinine Ratio: 18 (ref 9–20)
BUN: 15 mg/dL (ref 6–20)
Bilirubin Total: 0.5 mg/dL (ref 0.0–1.2)
CO2: 22 mmol/L (ref 20–29)
Calcium: 9.9 mg/dL (ref 8.7–10.2)
Chloride: 98 mmol/L (ref 96–106)
Creatinine, Ser: 0.83 mg/dL (ref 0.76–1.27)
Globulin, Total: 2.1 g/dL (ref 1.5–4.5)
Glucose: 92 mg/dL (ref 70–99)
Potassium: 4.3 mmol/L (ref 3.5–5.2)
Sodium: 137 mmol/L (ref 134–144)
Total Protein: 6.6 g/dL (ref 6.0–8.5)
eGFR: 119 mL/min/{1.73_m2} (ref >59)

## 2023-04-19 LAB — LIPID PANEL
Chol/HDL Ratio: 5 ratio (ref 0.0–5.0)
Cholesterol, Total: 203 mg/dL — ABNORMAL HIGH (ref 100–199)
HDL: 41 mg/dL (ref >39)
LDL Chol Calc (NIH): 144 mg/dL — ABNORMAL HIGH (ref 0–99)
Triglycerides: 101 mg/dL (ref 0–149)
VLDL Cholesterol Cal: 18 mg/dL (ref 5–40)

## 2023-04-19 LAB — TSH+FREE T4
Free T4: 1.43 ng/dL (ref 0.82–1.77)
TSH: 0.545 u[IU]/mL (ref 0.450–4.500)

## 2023-04-20 ENCOUNTER — Telehealth: Payer: Self-pay

## 2023-04-20 NOTE — Telephone Encounter (Signed)
-----   Message from Novella Olive, FNP sent at 04/19/2023  1:12 PM EDT ----- Please notify Cory Ochoa, his CBC is normal. No anemia or infection. Lipid shows slightly elevated cholesterol, and LDL cholesterol is 144. Recommend eating more vegetables and getting daily exercise to control this.  CMP: normal electrolytes, kidney and liver function.  Thyroid function is normal. A1C is normal, no diabetes or pre-diabetes.  Thanks, Clydie Braun

## 2023-04-20 NOTE — Telephone Encounter (Signed)
Spoke with patients mother, advised her of lab results for patient. She voiced a verbal understanding without any concerns

## 2024-03-22 ENCOUNTER — Other Ambulatory Visit: Payer: Self-pay | Admitting: Family Medicine

## 2024-03-22 DIAGNOSIS — J4540 Moderate persistent asthma, uncomplicated: Secondary | ICD-10-CM

## 2024-05-09 ENCOUNTER — Ambulatory Visit: Admitting: Family Medicine

## 2024-05-09 ENCOUNTER — Encounter: Payer: Self-pay | Admitting: Family Medicine

## 2024-05-09 VITALS — BP 117/77 | HR 91 | Temp 98.3°F | Ht 64.0 in | Wt 159.8 lb

## 2024-05-09 DIAGNOSIS — J4540 Moderate persistent asthma, uncomplicated: Secondary | ICD-10-CM | POA: Diagnosis not present

## 2024-05-09 DIAGNOSIS — K219 Gastro-esophageal reflux disease without esophagitis: Secondary | ICD-10-CM | POA: Diagnosis not present

## 2024-05-09 MED ORDER — OMEPRAZOLE 20 MG PO CPDR: 20.0000 mg | DELAYED_RELEASE_CAPSULE | Freq: Every day | ORAL | 3 refills | Status: AC

## 2024-05-09 MED ORDER — SYMBICORT 80-4.5 MCG/ACT IN AERO
2.0000 | INHALATION_SPRAY | Freq: Two times a day (BID) | RESPIRATORY_TRACT | 2 refills | Status: AC
Start: 2024-05-09 — End: ?

## 2024-05-09 NOTE — Assessment & Plan Note (Addendum)
 Asthma is controlled with Symbicort  2 puffs BID. No report of exacerbation.  Lungs are clear.  Refill sent.

## 2024-05-09 NOTE — Assessment & Plan Note (Signed)
 Symptoms present since birth. Intubated as baby. Has been taking OTC omeprazole  20 mg daily with good symptom control. Discussed dietary choices. Eats close to bedtime on some days. Omeprazole  20 mg sent to pharmacy. Follow-up for CPE in August.

## 2024-05-09 NOTE — Progress Notes (Signed)
   Established Patient Office Visit  Subjective   Patient ID: Cory Ochoa, male    DOB: [birth year]  Age: 33 y.o. 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  Does not watch his diet and eats all day long and close to bedtime. Taking OTC omeprazole  with good symptom relief. Does not eat red sauce.  Asthma: Symptoms are well controlled with Symbicort  BID.  Needs refill today.    ROS    Objective:     BP 117/77 (BP Location: Left Arm, Patient Position: Sitting, Cuff Size: Normal)   Pulse 91   Temp 98.3 F (36.8 C) (Oral)   Ht 5' 4 (1.626 m)   Wt 159 lb 12.8 oz (72.5 kg)   SpO2 97%   BMI 27.43 kg/m    Physical Exam Vitals and nursing note reviewed.  Constitutional:      General: He is not in acute distress.    Appearance: Normal appearance.  Cardiovascular:     Rate and Rhythm: Normal rate and regular rhythm.     Heart sounds: Normal heart sounds.  Pulmonary:     Effort: Pulmonary effort is normal.     Breath sounds: Normal breath sounds.  Skin:    General: Skin is warm and dry.  Neurological:     General: No focal deficit present.     Mental Status: He is alert. Mental status is at baseline.  Psychiatric:        Mood and Affect: Mood normal.        Behavior: Behavior normal.        Thought Content: Thought content normal.        Judgment: Judgment normal.      No results found for any visits on 05/09/24.    The ASCVD Risk score (Arnett DK, et al., 2019) failed to calculate for the following reasons:   The 2019 ASCVD risk score is only valid for ages 85 to 75    Assessment & Plan:   Problem List Items Addressed This Visit     Moderate persistent asthma   Asthma is controlled with Symbicort  2 puffs BID. No report of exacerbation.  Lungs are clear.  Refill sent.       Relevant Medications   SYMBICORT  80-4.5  MCG/ACT inhaler   Gastroesophageal reflux disease - Primary   Symptoms present since birth. Intubated as baby. Has been taking OTC omeprazole  20 mg daily with good symptom control. Discussed dietary choices. Eats close to bedtime on some days. Omeprazole  20 mg sent to pharmacy. Follow-up for CPE in August.        Relevant Medications   omeprazole  (PRILOSEC) 20 MG capsule  .Agrees with plan of care discussed.  Questions answered.   Return in about 4 weeks (around 06/06/2024) for CPE with labs.    Darice JONELLE Brownie, FNP

## 2024-06-06 ENCOUNTER — Ambulatory Visit (INDEPENDENT_AMBULATORY_CARE_PROVIDER_SITE_OTHER): Admitting: Family Medicine

## 2024-06-06 ENCOUNTER — Encounter: Payer: Self-pay | Admitting: Family Medicine

## 2024-06-06 VITALS — BP 125/84 | HR 96 | Temp 98.1°F | Ht 64.0 in | Wt 160.4 lb

## 2024-06-06 DIAGNOSIS — Z13220 Encounter for screening for lipoid disorders: Secondary | ICD-10-CM | POA: Diagnosis not present

## 2024-06-06 DIAGNOSIS — Z0000 Encounter for general adult medical examination without abnormal findings: Secondary | ICD-10-CM

## 2024-06-06 DIAGNOSIS — Z130 Encounter for screening for diseases of the blood and blood-forming organs and certain disorders involving the immune mechanism: Secondary | ICD-10-CM | POA: Diagnosis not present

## 2024-06-06 DIAGNOSIS — Z1159 Encounter for screening for other viral diseases: Secondary | ICD-10-CM | POA: Diagnosis not present

## 2024-06-06 DIAGNOSIS — Z136 Encounter for screening for cardiovascular disorders: Secondary | ICD-10-CM | POA: Diagnosis not present

## 2024-06-06 DIAGNOSIS — Z13228 Encounter for screening for other metabolic disorders: Secondary | ICD-10-CM | POA: Diagnosis not present

## 2024-06-06 NOTE — Progress Notes (Signed)
 Complete physical exam  Patient: Cory Ochoa   DOB: 06/22/1991   33 y.o. Male  MRN: [id number]  Subjective:    Chief Complaint  Patient presents with   Annual Exam    Mole on head , he's been picking at it    Cory Ochoa is a 33 y.o. male who presents today for a complete physical exam. He reports consuming a general diet. Helping with yard work  He generally feels well. He reports sleeping fairly well. He does not have additional problems to discuss today.    Most recent fall risk assessment:    05/09/2024    2:47 PM  Fall Risk   Falls in the past year? 0  Number falls in past yr: 0  Injury with Fall? 0  Risk for fall due to : No Fall Risks  Follow up Falls evaluation completed     Most recent depression screenings:    06/06/2024    8:27 AM 05/09/2024    2:48 PM  PHQ 2/9 Scores  PHQ - 2 Score 0 0  PHQ- 9 Score 0 0    Vision:Within last year and Dental: No current dental problems and No regular dental care     Patient Care Team: Booker Darice SAUNDERS, FNP as PCP - General (Family Medicine)   Outpatient Medications Prior to Visit  Medication Sig   albuterol  (VENTOLIN  HFA) 108 (90 Base) MCG/ACT inhaler Inhale 2 puffs into the lungs every 6 (six) hours as needed for wheezing or shortness of breath.   EPINEPHrine  0.3 mg/0.3 mL IJ SOAJ injection Inject 0.3 mg into the muscle as needed.   ipratropium-albuterol  (DUONEB) 0.5-2.5 (3) MG/3ML SOLN Take 3 mLs by nebulization daily as needed.   omeprazole  (PRILOSEC) 20 MG capsule Take 1 capsule (20 mg total) by mouth daily.   SYMBICORT  80-4.5 MCG/ACT inhaler Inhale 2 puffs into the lungs 2 times daily at 12 noon and 4 pm.   No facility-administered medications prior to visit.    ROS        Objective:     BP 125/84   Pulse 96   Temp 98.1 F (36.7 C) (Oral)   Ht 5' 4 (1.626 m)   Wt 160 lb 6.4 oz (72.8 kg)   SpO2 99%   BMI 27.53 kg/m    Physical Exam Vitals and nursing note reviewed.  Constitutional:       General: He is not in acute distress.    Appearance: Normal appearance.  HENT:     Right Ear: Tympanic membrane normal.     Left Ear: Tympanic membrane normal.     Nose: Nose normal.     Mouth/Throat:     Mouth: Mucous membranes are moist.     Pharynx: Oropharynx is clear.  Eyes:     Extraocular Movements: Extraocular movements intact.  Neck:     Thyroid: No thyroid tenderness.  Cardiovascular:     Rate and Rhythm: Normal rate and regular rhythm.     Pulses:          Radial pulses are 2+ on the right side and 2+ on the left side.     Heart sounds: Normal heart sounds, S1 normal and S2 normal.  Pulmonary:     Effort: Pulmonary effort is normal.     Breath sounds: Normal breath sounds.  Abdominal:     General: Bowel sounds are normal.     Palpations: Abdomen is soft.     Tenderness: There is no abdominal  tenderness.  Musculoskeletal:        General: Normal range of motion.     Cervical back: Normal range of motion.     Right lower leg: No edema.     Left lower leg: No edema.  Lymphadenopathy:     Cervical:     Right cervical: No superficial cervical adenopathy.    Left cervical: No superficial cervical adenopathy.  Skin:    General: Skin is warm and dry.  Neurological:     General: No focal deficit present.     Mental Status: He is alert. Mental status is at baseline.  Psychiatric:        Mood and Affect: Mood normal.        Behavior: Behavior normal.        Thought Content: Thought content normal.        Judgment: Judgment normal.      No results found for any visits on 06/06/24.     Assessment & Plan:    Routine Health Maintenance and Physical Exam  Immunization History  Administered Date(s) Administered   Hepatitis A 07/01/2008   Meningococcal Conjugate 10/09/2006   Pneumococcal Conjugate-13 08/28/2009   Tdap 06/16/2006    Health Maintenance  Topic Date Due   HIV Screening  Never done   COVID-19 Vaccine (1 - 2024-25 season) 06/22/2024 (Originally  07/02/2023)   INFLUENZA VACCINE  01/28/2025 (Originally 05/31/2024)   Pneumococcal Vaccine: 19-49 Years (2 of 2 - PPSV23, PCV20, or PCV21) 06/06/2025 (Originally 10/23/2009)   Hepatitis B Vaccines (1 of 3 - 19+ 3-dose series) 06/06/2025 (Originally 11/19/2009)   HPV VACCINES (1 - 3-dose SCDM series) 06/06/2025 (Originally 11/19/2017)   Hepatitis C Screening  06/06/2025 (Originally 11/19/2008)   Meningococcal B Vaccine  Aged Out   DTaP/Tdap/Td  Discontinued    Discussed health benefits of physical activity, and encouraged him to engage in regular exercise appropriate for his age and condition.  Annual physical exam -     CBC -     Comprehensive metabolic panel with GFR  Encounter for lipid screening for cardiovascular disease -     Lipid panel  Encounter for screening for metabolic disorder -     Comprehensive metabolic panel with GFR -     Hemoglobin A1c -     TSH + free T4  Screening for viral disease -     Hepatitis C antibody -     HIV Antibody (routine testing w rflx)      Routine labs ordered.  HCM reviewed/discussed. Hep C and HIV today. Anticipatory guidance regarding healthy weight, lifestyle and choices given. Recommend healthy diet.  Recommend approximately 150 minutes/week of moderate intensity exercise. Resistance training is good for building muscles and for bone health. Muscle mass helps to increase our metabolism and to burn more calories at rest.  Limit alcohol consumption: no more than one drink per day for women and 2 drinks per day for me. Recommend regular dental and vision exams. Always use seatbelt/lap and shoulder restraints. Recommend using smoke alarms and checking batteries at least twice a year. Recommend using sunscreen when outside.  Please know that I am here to help you with all of your health care goals and am happy to work with you to find a solution that works best for you.  The greatest advice I have received with any changes in life are to take it  one step at a time, that even means if all you can focus on is the  next 60 seconds, then do that and celebrate your victories.  With any changes in life, you will have set backs, and that is OK. The important thing to remember is, if you have a set back, it is not a failure, it is an opportunity to try again! Agrees with plan of care discussed.  Questions answered.      Return in about 1 year (around 06/09/2025) for CPE with labs.     Darice JONELLE Brownie, FNP

## 2024-06-07 ENCOUNTER — Ambulatory Visit: Payer: Self-pay | Admitting: Family Medicine

## 2024-06-07 LAB — COMPREHENSIVE METABOLIC PANEL WITH GFR
ALT: 40 IU/L (ref 0–44)
AST: 25 IU/L (ref 0–40)
Albumin: 4.6 g/dL (ref 4.1–5.1)
Alkaline Phosphatase: 99 IU/L (ref 44–121)
BUN/Creatinine Ratio: 18 (ref 9–20)
BUN: 18 mg/dL (ref 6–20)
Bilirubin Total: 0.5 mg/dL (ref 0.0–1.2)
CO2: 22 mmol/L (ref 20–29)
Calcium: 10.1 mg/dL (ref 8.7–10.2)
Chloride: 99 mmol/L (ref 96–106)
Creatinine, Ser: 1.01 mg/dL (ref 0.76–1.27)
Globulin, Total: 2.3 g/dL (ref 1.5–4.5)
Glucose: 92 mg/dL (ref 70–99)
Potassium: 4.3 mmol/L (ref 3.5–5.2)
Sodium: 139 mmol/L (ref 134–144)
Total Protein: 6.9 g/dL (ref 6.0–8.5)
eGFR: 101 mL/min/1.73 (ref >59)

## 2024-06-07 LAB — CBC
Hematocrit: 52.7 % — ABNORMAL HIGH (ref 37.5–51.0)
Hemoglobin: 17 g/dL (ref 13.0–17.7)
MCH: 29.3 pg (ref 26.6–33.0)
MCHC: 32.3 g/dL (ref 31.5–35.7)
MCV: 91 fL (ref 79–97)
Platelets: 318 x10E3/uL (ref 150–450)
RBC: 5.81 x10E6/uL — ABNORMAL HIGH (ref 4.14–5.80)
RDW: 12.8 % (ref 11.6–15.4)
WBC: 6.9 x10E3/uL (ref 3.4–10.8)

## 2024-06-07 LAB — LIPID PANEL
Chol/HDL Ratio: 5.3 ratio — ABNORMAL HIGH (ref 0.0–5.0)
Cholesterol, Total: 229 mg/dL — ABNORMAL HIGH (ref 100–199)
HDL: 43 mg/dL (ref >39)
LDL Chol Calc (NIH): 155 mg/dL — ABNORMAL HIGH (ref 0–99)
Triglycerides: 169 mg/dL — ABNORMAL HIGH (ref 0–149)
VLDL Cholesterol Cal: 31 mg/dL (ref 5–40)

## 2024-06-07 LAB — HEMOGLOBIN A1C
Est. average glucose Bld gHb Est-mCnc: 105 mg/dL
Hgb A1c MFr Bld: 5.3 % (ref 4.8–5.6)

## 2024-06-07 LAB — TSH+FREE T4
Free T4: 1.38 ng/dL (ref 0.82–1.77)
TSH: 1.31 u[IU]/mL (ref 0.450–4.500)

## 2024-06-07 LAB — HIV ANTIBODY (ROUTINE TESTING W REFLEX): HIV Screen 4th Generation wRfx: NONREACTIVE

## 2024-06-07 LAB — HEPATITIS C ANTIBODY: Hep C Virus Ab: NONREACTIVE

## 2024-07-24 ENCOUNTER — Other Ambulatory Visit: Payer: Self-pay | Admitting: Family Medicine

## 2024-10-28 ENCOUNTER — Encounter: Payer: Self-pay | Admitting: Family Medicine

## 2025-06-09 ENCOUNTER — Encounter: Admitting: Family Medicine
